# Patient Record
Sex: Male | Born: 2000 | Race: White | Hispanic: No | Marital: Single | Smoking: Never smoker
Health system: Southern US, Community
[De-identification: ages and names within clinical notes are randomized; demographics above are authoritative.]

---
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
## 2022-03-28 ENCOUNTER — Encounter: Payer: Self-pay | Admitting: Family Medicine

## 2022-04-15 ENCOUNTER — Encounter: Payer: Self-pay | Admitting: Family Medicine

## 2022-04-15 ENCOUNTER — Other Ambulatory Visit: Payer: Self-pay

## 2022-04-15 ENCOUNTER — Ambulatory Visit: Payer: Medicaid (Managed Care) | Attending: Family Medicine | Admitting: Family Medicine

## 2022-04-15 VITALS — BP 118/72 | HR 65 | Temp 97.3°F | Resp 18 | Ht 67.72 in | Wt 152.4 lb

## 2022-04-15 DIAGNOSIS — Z13220 Encounter for screening for lipoid disorders: Secondary | ICD-10-CM

## 2022-04-15 DIAGNOSIS — Z131 Encounter for screening for diabetes mellitus: Secondary | ICD-10-CM | POA: Insufficient documentation

## 2022-04-15 DIAGNOSIS — Z1329 Encounter for screening for other suspected endocrine disorder: Secondary | ICD-10-CM | POA: Insufficient documentation

## 2022-04-15 DIAGNOSIS — Z130 Encounter for screening for diseases of the blood and blood-forming organs and certain disorders involving the immune mechanism: Secondary | ICD-10-CM | POA: Insufficient documentation

## 2022-04-15 DIAGNOSIS — Z7689 Persons encountering health services in other specified circumstances: Secondary | ICD-10-CM

## 2022-04-15 DIAGNOSIS — Z0000 Encounter for general adult medical examination without abnormal findings: Secondary | ICD-10-CM
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
## 2022-11-14 ENCOUNTER — Ambulatory Visit: Payer: Medicaid (Managed Care) | Admitting: Family Medicine

## 2022-11-19 ENCOUNTER — Ambulatory Visit: Payer: Medicaid (Managed Care) | Admitting: Family Medicine

## 2022-11-21 ENCOUNTER — Encounter: Payer: Self-pay | Admitting: Family Medicine

## 2023-04-16 ENCOUNTER — Encounter: Payer: Medicaid (Managed Care) | Admitting: Family Medicine

## 2023-04-17 ENCOUNTER — Ambulatory Visit: Payer: Medicaid (Managed Care) | Admitting: Internal Medicine
